# Patient Record
Sex: Male | Born: 1965 | Race: Black or African American | Hispanic: No | Marital: Married | Smoking: Never smoker
Health system: Southern US, Community
[De-identification: ages and names within clinical notes are randomized; demographics above are authoritative.]

## PROBLEM LIST (undated history)

## (undated) DIAGNOSIS — E119 Type 2 diabetes mellitus without complications: Secondary | ICD-10-CM

## (undated) DIAGNOSIS — M1990 Unspecified osteoarthritis, unspecified site: Secondary | ICD-10-CM

## (undated) DIAGNOSIS — Z860100 Personal history of colon polyps, unspecified: Secondary | ICD-10-CM

## (undated) DIAGNOSIS — K922 Gastrointestinal hemorrhage, unspecified: Secondary | ICD-10-CM

## (undated) DIAGNOSIS — E7800 Pure hypercholesterolemia, unspecified: Secondary | ICD-10-CM

## (undated) DIAGNOSIS — F419 Anxiety disorder, unspecified: Secondary | ICD-10-CM

## (undated) DIAGNOSIS — I341 Nonrheumatic mitral (valve) prolapse: Secondary | ICD-10-CM

## (undated) DIAGNOSIS — M797 Fibromyalgia: Secondary | ICD-10-CM

## (undated) DIAGNOSIS — D649 Anemia, unspecified: Secondary | ICD-10-CM

## (undated) DIAGNOSIS — K7581 Nonalcoholic steatohepatitis (NASH): Secondary | ICD-10-CM

## (undated) DIAGNOSIS — M109 Gout, unspecified: Secondary | ICD-10-CM

## (undated) DIAGNOSIS — K317 Polyp of stomach and duodenum: Secondary | ICD-10-CM

## (undated) HISTORY — PX: ABDOMINAL HYSTERECTOMY: SHX81

## (undated) HISTORY — DX: Personal history of colonic polyps: Z86.010

## (undated) HISTORY — PX: CERVICAL FUSION: SHX112

## (undated) HISTORY — DX: Fibromyalgia: M79.7

## (undated) HISTORY — DX: Anemia, unspecified: D64.9

## (undated) HISTORY — DX: Type 2 diabetes mellitus without complications: E11.9

## (undated) HISTORY — DX: Nonrheumatic mitral (valve) prolapse: I34.1

## (undated) HISTORY — PX: APPENDECTOMY: SHX54

## (undated) HISTORY — PX: SHOULDER SURGERY: SHX246

## (undated) HISTORY — DX: Anxiety disorder, unspecified: F41.9

## (undated) HISTORY — DX: Personal history of colon polyps, unspecified: Z86.0100

## (undated) HISTORY — DX: Unspecified osteoarthritis, unspecified site: M19.90

## (undated) HISTORY — PX: FOOT SURGERY: SHX648

## (undated) HISTORY — PX: KNEE SURGERY: SHX244

## (undated) HISTORY — DX: Pure hypercholesterolemia, unspecified: E78.00

## (undated) HISTORY — DX: Gout, unspecified: M10.9

## (undated) HISTORY — PX: TONSILLECTOMY: SUR1361

## (undated) HISTORY — PX: CHOLECYSTECTOMY: SHX55

## (undated) HISTORY — PX: OTHER SURGICAL HISTORY: SHX169

## (undated) HISTORY — DX: Polyp of stomach and duodenum: K31.7

## (undated) HISTORY — DX: Nonalcoholic steatohepatitis (NASH): K75.81

## (undated) HISTORY — DX: Gastrointestinal hemorrhage, unspecified: K92.2

---

## 2006-05-09 ENCOUNTER — Emergency Department (HOSPITAL_COMMUNITY): Admission: EM | Admit: 2006-05-09 | Discharge: 2006-05-09 | Payer: Self-pay | Admitting: Emergency Medicine

## 2006-06-21 ENCOUNTER — Encounter: Admission: RE | Admit: 2006-06-21 | Discharge: 2006-06-21 | Payer: Self-pay | Admitting: Neurosurgery

## 2006-08-26 ENCOUNTER — Inpatient Hospital Stay (HOSPITAL_COMMUNITY): Admission: RE | Admit: 2006-08-26 | Discharge: 2006-08-27 | Payer: Self-pay | Admitting: Neurosurgery

## 2008-01-19 IMAGING — CT CT CERVICAL SPINE W/O CM
3 of 7 series · 9 of 29 positions shown, 11 images · IV contrast (agent unspecified)
Comparison: Plain films of the cervical spine dated 05/09/06 were reviewed.

CLINICAL DATA: Evaluate fracture.
 CERVICAL SPINE CT WITHOUT CONTRAST:
TECHNIQUE: Multidetector CT imaging of the cervical spine was performed.  Multiplanar CT image reconstructions were also generated.

[Series 2: — · axial · 0.27mm/px · z∈[+74,+145]mm · 2 of 172 slices shown]
[im 58/172  bone]
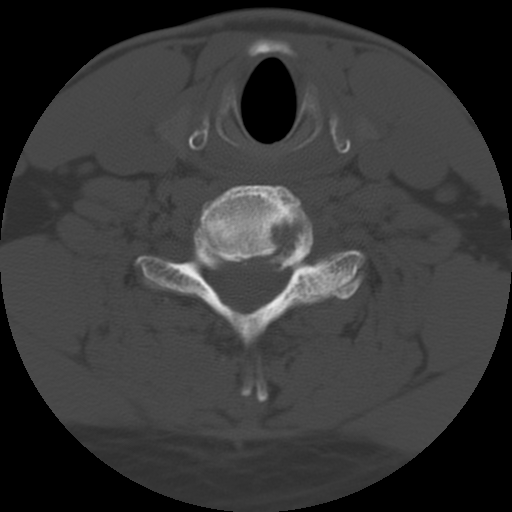
[im 115/172  bone]
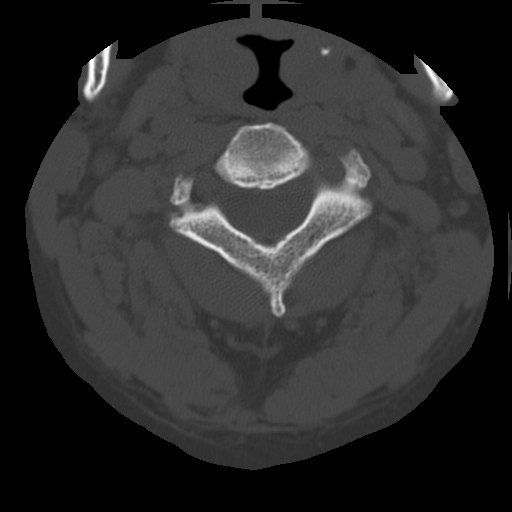

[Series 4: recon 3: · axial · 0.27mm/px · z∈[+38,+181]mm · 5 of 343 slices shown, 7 images]
[im 58/343  soft-tissue]
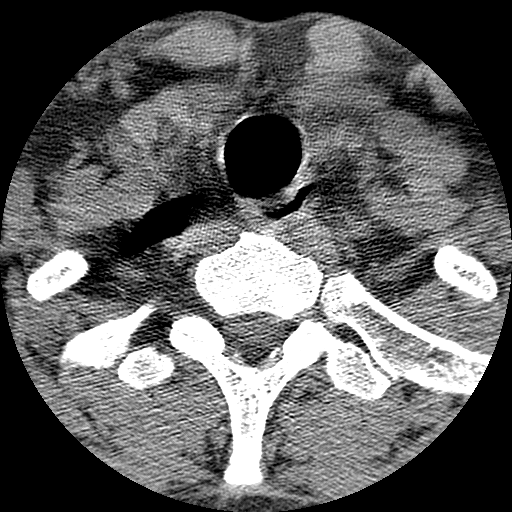
[im 58/343  bone]
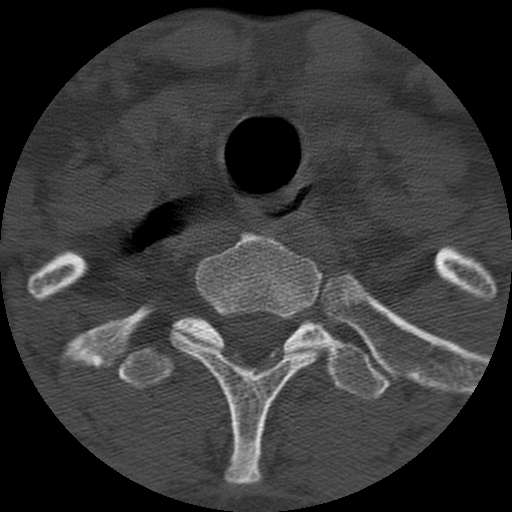
[im 115/343  bone]
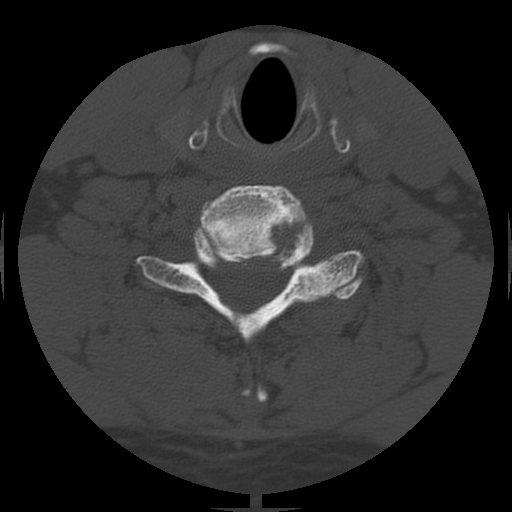
[im 172/343  bone]
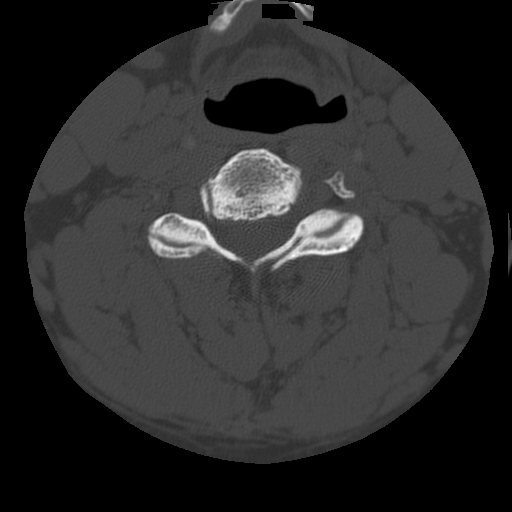
[im 229/343  bone]
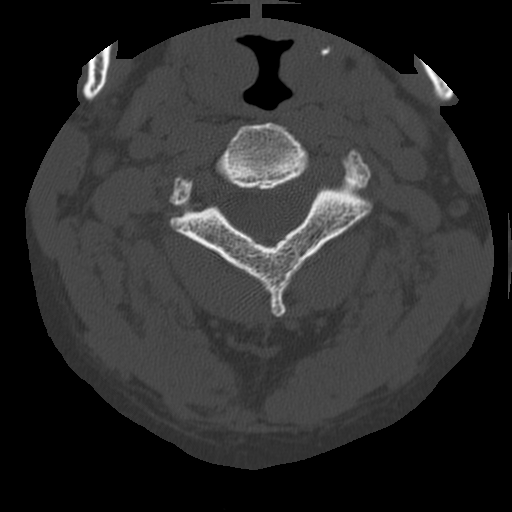
[im 286/343  soft-tissue]
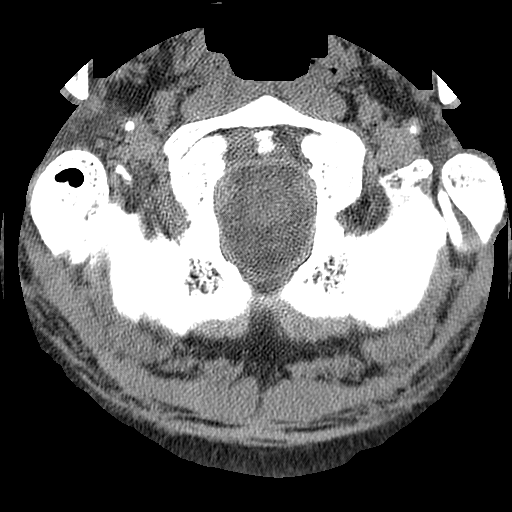
[im 286/343  bone]
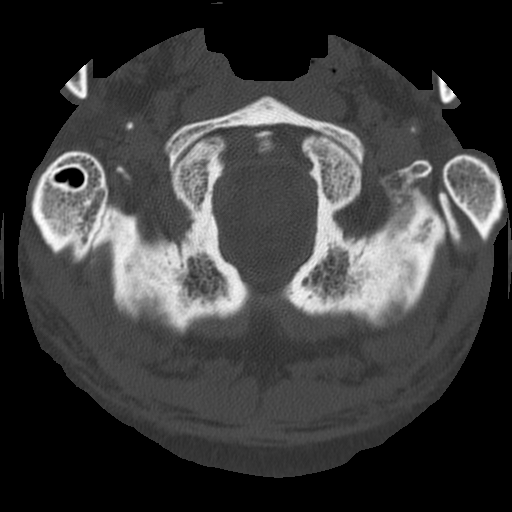

[Series 118: reformatted · sagittal · 0.42mm/px · 2 of 60 slices shown]
[im 20/60  bone]
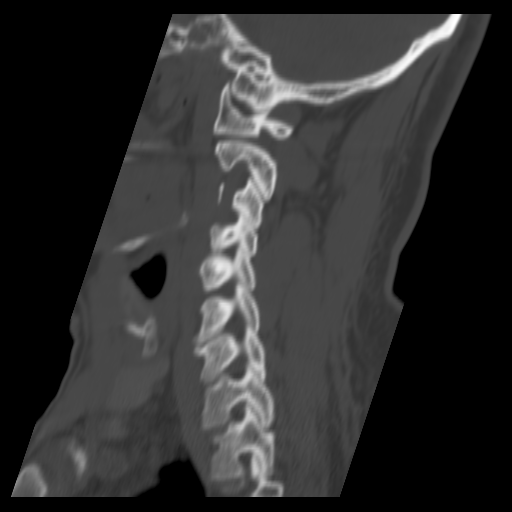
[im 40/60  bone]
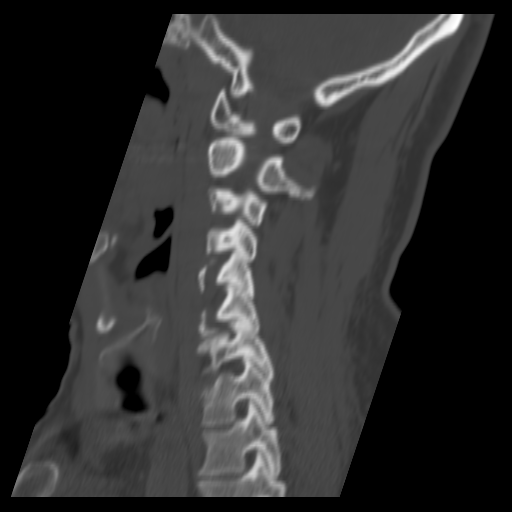

[9 of 29 positions shown; findings below may reference images not displayed]

The odontoid process is intact and the relationship of C1 and C2 appears normal.  There is degenerative disk disease at C4-5, C5-6, and C6-7 levels with loss of disk space, sclerosis and spur formation.  There is bilateral foraminal narrowing particularly at C4-5 and C5-6.  There is fracture of the spinous process of C6 noted.  No evidence of callus formation is seen.  Fracture fragments are separated by approximately 3 mm.  No additional cervical spine fracture is seen.  There is question of nondisplaced fracture of the left first rib which is slightly irregular.
IMPRESSION: 1.  Slightly displaced fracture through the spinous process of C6.  No evidence of callus formation is seen. 
 2.  Question of subtle fracture of the posterior left first rib.
 3. Degenerative disk disease at C4-5, C5-6, and C6-7.

## 2008-03-25 IMAGING — CR DG CERVICAL SPINE 2 OR 3 VIEWS
1 series · 1 of 1 positions shown · non-contrast
Comparison: none

CLINICAL DATA: Neck pain, fracture. 
CERVICAL SPINE ? 1 VIEW:

[view not recorded]
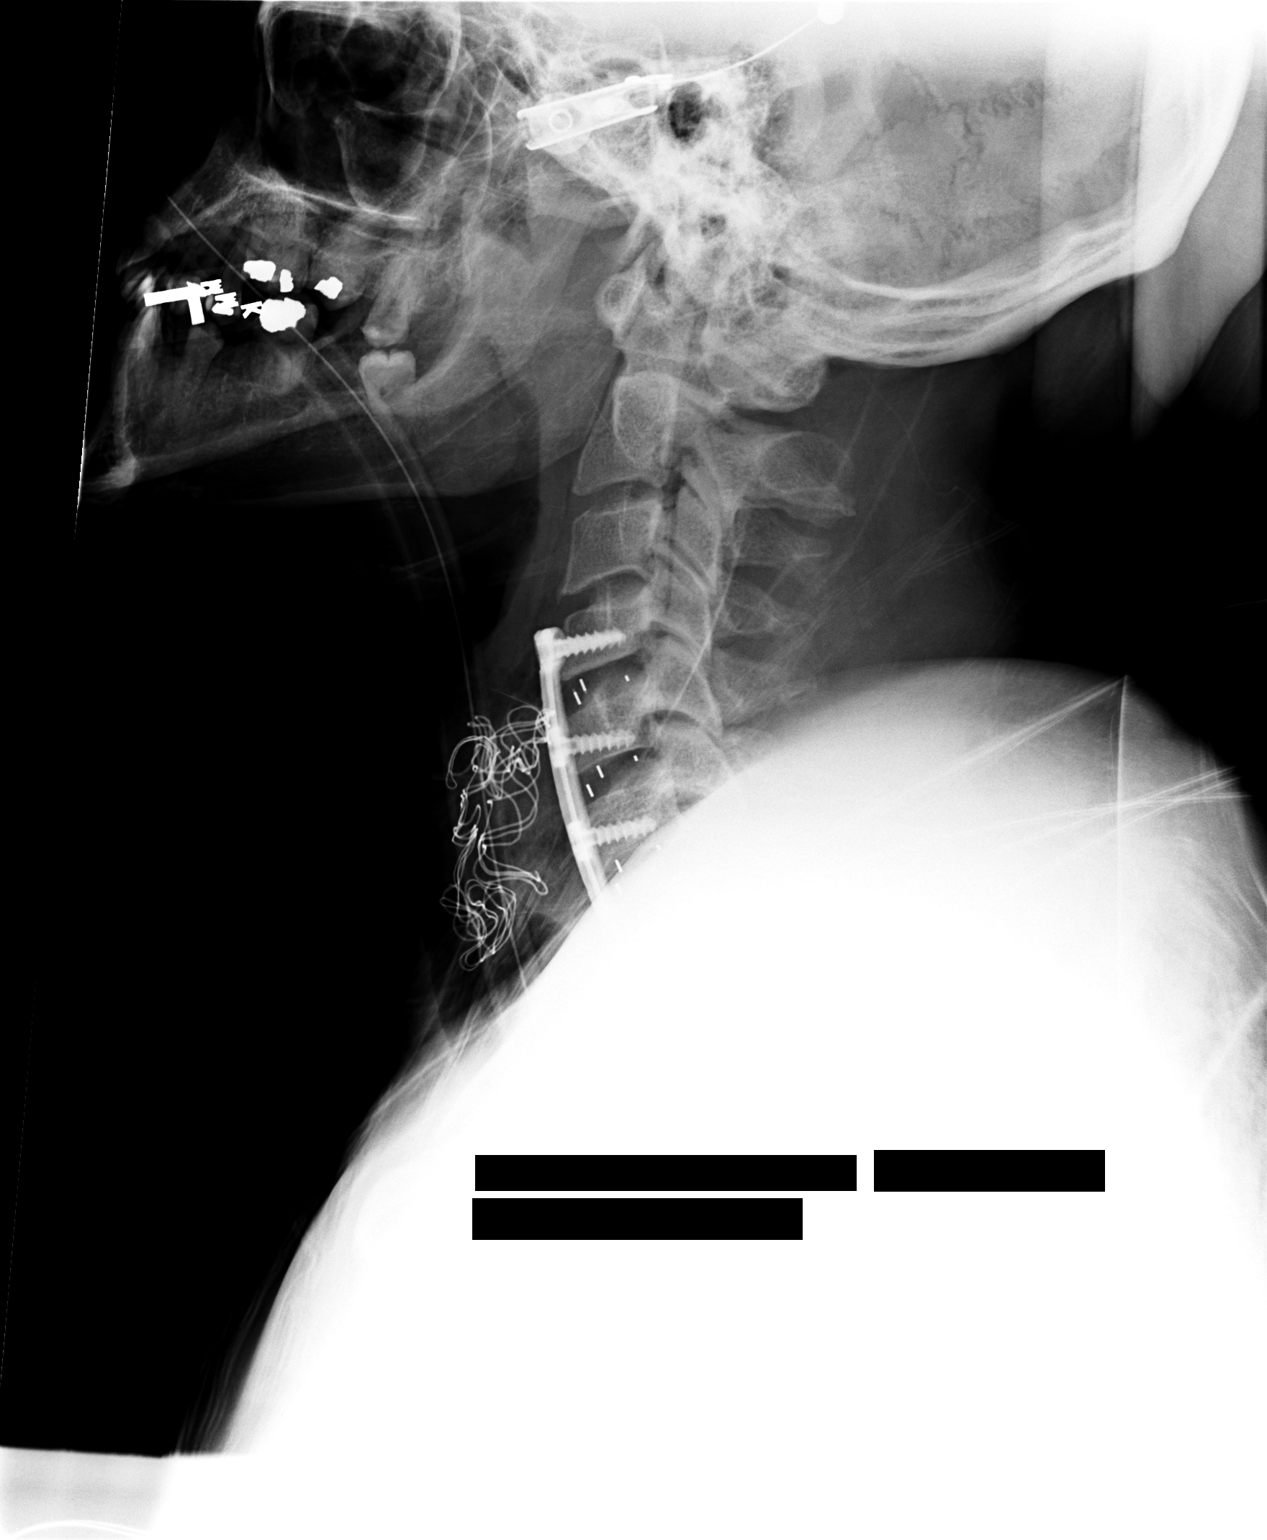

[1 of 1 positions shown; findings below may reference images not displayed]

FINDINGS: Intraoperative lateral film #1 demonstrates needles directed at C5-6 and C6-7.
IMPRESSION: C5-6 and C6-7 marked. 
CERVICAL SPINE ? 1 VIEW:
FINDINGS: Film #2 demonstrates anterior cervical discectomy with interbody fusion from C-4 through C-7 without adverse features.
IMPRESSION: C4-C7 fusion.

## 2010-12-03 ENCOUNTER — Encounter: Payer: Self-pay | Admitting: Neurosurgery

## 2015-04-25 HISTORY — PX: COLONOSCOPY: SHX174

## 2015-12-28 HISTORY — PX: ESOPHAGOGASTRODUODENOSCOPY: SHX1529

## 2018-11-27 ENCOUNTER — Encounter: Payer: Self-pay | Admitting: Gastroenterology

## 2018-12-03 ENCOUNTER — Encounter: Payer: Self-pay | Admitting: Gastroenterology

## 2018-12-05 ENCOUNTER — Ambulatory Visit: Payer: BLUE CROSS/BLUE SHIELD | Admitting: Gastroenterology

## 2020-12-23 ENCOUNTER — Ambulatory Visit (AMBULATORY_SURGERY_CENTER): Payer: Self-pay | Admitting: *Deleted

## 2020-12-23 ENCOUNTER — Other Ambulatory Visit: Payer: Self-pay

## 2020-12-23 VITALS — Ht 71.0 in | Wt 200.0 lb

## 2020-12-23 DIAGNOSIS — Z86010 Personal history of colonic polyps: Secondary | ICD-10-CM

## 2020-12-23 MED ORDER — PLENVU 140 G PO SOLR: 1.0000 | Freq: Once | ORAL | 0 refills | Status: AC

## 2020-12-23 NOTE — Progress Notes (Signed)
No egg or soy allergy known to patient  No issues with past sedation with any surgeries or procedures No intubation problems in the past  No FH of Malignant Hyperthermia No diet pills per patient No home 02 use per patient  No blood thinners per patient  Pt denies issues with constipation  No A fib or A flutter  EMMI video to pt or via [place] 19 guidelines implemented in PV today with Pt and RN  Pt is fully vaccinated  for Covid  Pt denies loose or missing teeth, denies dentures, partials, dental implants, capped or bonded teeth   Coupon given to pt in PV today , Code to Pharmacy and  NO PA's for preps discussed with pt In PV today  Discussed with pt there will be an out-of-pocket cost for prep and that varies from $0 to 70 dollars   Due to the COVID-19 pandemic we are asking patients to follow certain guidelines.  Pt aware of COVID protocols and [facility] guidelines

## 2020-12-23 NOTE — Progress Notes (Signed)
Completed in-person pre visit with patient. Discovered multiple incorrect entries in patient's health history. Changed all incorrect entries to "no" and suggested patient check with PCP to make sure there was not patient in that practice with same/ similar  name.

## 2021-01-09 ENCOUNTER — Other Ambulatory Visit: Payer: Self-pay

## 2021-01-09 ENCOUNTER — Encounter: Payer: Self-pay | Admitting: Gastroenterology

## 2021-01-09 ENCOUNTER — Ambulatory Visit (AMBULATORY_SURGERY_CENTER): Payer: PRIVATE HEALTH INSURANCE | Admitting: Gastroenterology

## 2021-01-09 VITALS — BP 128/89 | HR 69 | Temp 97.7°F | Resp 23 | Ht 71.0 in | Wt 205.0 lb

## 2021-01-09 DIAGNOSIS — D120 Benign neoplasm of cecum: Secondary | ICD-10-CM

## 2021-01-09 DIAGNOSIS — D123 Benign neoplasm of transverse colon: Secondary | ICD-10-CM

## 2021-01-09 DIAGNOSIS — Z860100 Personal history of colon polyps, unspecified: Secondary | ICD-10-CM

## 2021-01-09 MED ORDER — SODIUM CHLORIDE 0.9 % IV SOLN: 500.0000 mL | INTRAVENOUS | Status: DC

## 2021-01-09 NOTE — Progress Notes (Signed)
pt tolerated well. VSS. awake and to recovery. Report given to RN.  

## 2021-01-09 NOTE — Op Note (Signed)
Robert Patterson Patient Name: Robert Patterson Procedure Date: 01/09/2021 3:10 PM MRN: [id number] Endoscopist: Jackquline Denmark , MD Age: 55 Referring MD:  Date of Birth: 10-09-1966 Gender: Male Account #: [id number] Procedure:                Colonoscopy Indications:              High risk colon cancer surveillance: Personal                            history of colonic polyps Medicines:                Monitored Anesthesia Care Procedure:                Pre-Anesthesia Assessment:                           - Prior to the procedure, a History and Physical                            was performed, and patient medications and                            allergies were reviewed. The patient's tolerance of                            previous anesthesia was also reviewed. The risks                            and benefits of the procedure and the sedation                            options and risks were discussed with the patient.                            All questions were answered, and informed consent                            was obtained. Prior Anticoagulants: The patient has                            taken no previous anticoagulant or antiplatelet                            agents. ASA Grade Assessment: II - A patient with                            mild systemic disease. After reviewing the risks                            and benefits, the patient was deemed in                            satisfactory condition to undergo the procedure.  After obtaining informed consent, the colonoscope                            was passed under direct vision. Throughout the                            procedure, the patient's blood pressure, pulse, and                            oxygen saturations were monitored continuously. The                            Olympus PFC-[id number] (#[id number]) Colonoscope was                            introduced through the anus and advanced to the  the                            cecum, identified by appendiceal orifice and                            ileocecal valve. The colonoscopy was performed                            without difficulty. The patient tolerated the                            procedure well. The quality of the bowel                            preparation was good. The ileocecal valve,                            appendiceal orifice, and rectum were photographed. Scope In: 3:21:19 PM Scope Out: 3:50:38 PM Scope Withdrawal Time: 0 hours 19 minutes 15 seconds  Total Procedure Duration: 0 hours 29 minutes 19 seconds  Findings:                 A 10 mm polyp was found in the cecum. The polyp was                            semi-sessile. The polyp was removed with a hot                            snare. Resection and retrieval were complete using                            a Roth basket. Estimated blood loss: none.                           Two semi-sessile polyps were found in the mid                            transverse colon. The polyps  were 5mm and 10 mm in                            size. Smaller polyp was removed by cold snare and                            the larger polyp with removed hot snare. Resection                            and retrieval were complete using a Roth basket.                           A few medium-mouthed diverticula were found in the                            sigmoid colon, transverse colon and ascending colon.                           Non-bleeding internal hemorrhoids were found during                            retroflexion. The hemorrhoids were moderate.                           The exam was otherwise without abnormality on                            direct and retroflexion views. Complications:            No immediate complications. Estimated Blood Loss:     Estimated blood loss: none. Impression:               -Colonic polyps s/p polypectomy.                           -Mild pancolonic  diverticulosis.                           -Non-bleeding internal hemorrhoids.                           -The examination was otherwise normal on direct and                            retroflexion views. Recommendation:           - Patient has a contact number available for                            emergencies. The signs and symptoms of potential                            delayed complications were discussed with the                            patient. Return to normal activities tomorrow.  Written discharge instructions were provided to the                            patient.                           - Resume previous diet.                           - Continue present medications.                           - No aspirin, ibuprofen, naproxen, or other                            non-steroidal anti-inflammatory drugs for 5 days                            after polyp removal.                           - Await pathology results.                           - Repeat colonoscopy in 3 years for surveillance                            based on pathology results.                           - Return to GI clinic PRN.                           - The findings and recommendations were discussed                            with the patient's wife Robert Patterson. Jackquline Denmark, MD 01/09/2021 3:58:54 PM This report has been signed electronically.

## 2021-01-09 NOTE — Progress Notes (Signed)
Called to room to assist during endoscopic procedure.  Patient ID and intended procedure confirmed with present staff. Received instructions for my participation in the procedure from the performing physician.  

## 2021-01-09 NOTE — Patient Instructions (Signed)
YOU HAD AN ENDOSCOPIC PROCEDURE TODAY AT THE [facility]:   Refer to the procedure report that was given to you for any specific questions about what was found during the examination.  If the procedure report does not answer your questions, please call your gastroenterologist to clarify.  If you requested that your care partner not be given the details of your procedure findings, then the procedure report has been included in a sealed envelope for you to review at your convenience later.  YOU SHOULD EXPECT: Some feelings of bloating in the abdomen. Passage of more gas than usual.  Walking can help get rid of the air that was put into your GI tract during the procedure and reduce the bloating. If you had a lower endoscopy (such as a colonoscopy or flexible sigmoidoscopy) you may notice spotting of blood in your stool or on the toilet paper. If you underwent a bowel prep for your procedure, you may not have a normal bowel movement for a few days.  Please Note:  You might notice some irritation and congestion in your nose or some drainage.  This is from the oxygen used during your procedure.  There is no need for concern and it should clear up in a day or so.  SYMPTOMS TO REPORT IMMEDIATELY:   Following lower endoscopy (colonoscopy or flexible sigmoidoscopy):  Excessive amounts of blood in the stool  Significant tenderness or worsening of abdominal pains  Swelling of the abdomen that is new, acute  Fever of 100F or higher   Following upper endoscopy (EGD)  Vomiting of blood or coffee ground material  New chest pain or pain under the shoulder blades  Painful or persistently difficult swallowing  New shortness of breath  Fever of 100F or higher  Black, tarry-looking stools  For urgent or emergent issues, a gastroenterologist can be reached at any hour by calling [phone]. Do not use MyChart messaging for urgent concerns.    DIET:  We do recommend a small meal at first, but  then you may proceed to your regular diet.  Drink plenty of fluids but you should avoid alcoholic beverages for 24 hours.  ACTIVITY:  You should plan to take it easy for the rest of today and you should NOT DRIVE or use heavy machinery until tomorrow (because of the sedation medicines used during the test).    FOLLOW UP: Our staff will call the number listed on your records 48-72 hours following your procedure to check on you and address any questions or concerns that you may have regarding the information given to you following your procedure. If we do not reach you, we will leave a message.  We will attempt to reach you two times.  During this call, we will ask if you have developed any symptoms of COVID 19. If you develop any symptoms (ie: fever, flu-like symptoms, shortness of breath, cough etc.) before then, please call [phone].  If you test positive for Covid 19 in the 2 weeks post procedure, please call and report this information to us.    If any biopsies were taken you will be contacted by phone or by letter within the next 1-3 weeks.  Please call us at [phone] if you have not heard about the biopsies in 3 weeks.    SIGNATURES/CONFIDENTIALITY: You and/or your care partner have signed paperwork which will be entered into your electronic medical record.  These signatures attest to the fact that that the information above on   your After Visit Summary has been reviewed and is understood.  Full responsibility of the confidentiality of this discharge information lies with you and/or your care-partner. 

## 2021-01-11 ENCOUNTER — Telehealth: Payer: Self-pay

## 2021-01-11 NOTE — Telephone Encounter (Signed)
  Follow up Call-  Call back number 01/09/2021  Post procedure Call Back phone  # [phone]  Permission to leave phone message Yes  Some recent data might be hidden     Patient questions:  Do you have a fever, pain , or abdominal swelling? No. Pain Score  0 *  Have you tolerated food without any problems? Yes.    Have you been able to return to your normal activities? Yes.    Do you have any questions about your discharge instructions: Diet   No. Medications  No. Follow up visit  No.  Do you have questions or concerns about your Care? No.  Actions: * If pain score is 4 or above: 1. No action needed, pain <4.Have you developed a fever since your procedure? no  2.   Have you had an respiratory symptoms (SOB or cough) since your procedure? no  3.   Have you tested positive for COVID 19 since your procedure no  4.   Have you had any family members/close contacts diagnosed with the COVID 19 since your procedure?  no   If yes to any of these questions please route to Joylene John, RN and Joella Prince, RN

## 2021-02-04 ENCOUNTER — Encounter: Payer: Self-pay | Admitting: Gastroenterology

## 2024-03-04 ENCOUNTER — Encounter: Payer: Self-pay | Admitting: Gastroenterology

## 2024-05-06 ENCOUNTER — Encounter: Payer: PRIVATE HEALTH INSURANCE | Admitting: Gastroenterology

## 2024-08-20 ENCOUNTER — Encounter: Payer: Self-pay | Admitting: Gastroenterology
